# Patient Record
Sex: Female | Born: 1960 | Race: White | Hispanic: No | Marital: Married
Health system: Southern US, Community
[De-identification: ages and names within clinical notes are randomized; demographics above are authoritative.]

---

## 2015-12-12 ENCOUNTER — Other Ambulatory Visit: Payer: Self-pay | Admitting: Internal Medicine

## 2015-12-12 DIAGNOSIS — Z1231 Encounter for screening mammogram for malignant neoplasm of breast: Secondary | ICD-10-CM

## 2015-12-22 ENCOUNTER — Ambulatory Visit
Admission: RE | Admit: 2015-12-22 | Discharge: 2015-12-22 | Disposition: A | Discharge status: Home / Self Care | Payer: 59 | Source: Ambulatory Visit | Attending: Internal Medicine | Admitting: Internal Medicine

## 2015-12-22 DIAGNOSIS — Z1231 Encounter for screening mammogram for malignant neoplasm of breast: Secondary | ICD-10-CM

## 2016-10-04 DIAGNOSIS — L989 Disorder of the skin and subcutaneous tissue, unspecified: Secondary | ICD-10-CM | POA: Diagnosis not present

## 2016-10-04 DIAGNOSIS — M25562 Pain in left knee: Secondary | ICD-10-CM | POA: Diagnosis not present

## 2016-12-19 ENCOUNTER — Other Ambulatory Visit: Payer: Self-pay | Admitting: Internal Medicine

## 2016-12-19 DIAGNOSIS — Z1231 Encounter for screening mammogram for malignant neoplasm of breast: Secondary | ICD-10-CM

## 2017-01-09 ENCOUNTER — Ambulatory Visit
Admission: RE | Admit: 2017-01-09 | Discharge: 2017-01-09 | Disposition: A | Discharge status: Home / Self Care | Payer: 59 | Source: Ambulatory Visit | Attending: Internal Medicine | Admitting: Internal Medicine

## 2017-01-09 DIAGNOSIS — Z1231 Encounter for screening mammogram for malignant neoplasm of breast: Secondary | ICD-10-CM

## 2017-10-16 DIAGNOSIS — Z1211 Encounter for screening for malignant neoplasm of colon: Secondary | ICD-10-CM | POA: Diagnosis not present

## 2017-10-16 DIAGNOSIS — Z01419 Encounter for gynecological examination (general) (routine) without abnormal findings: Secondary | ICD-10-CM | POA: Diagnosis not present

## 2017-10-16 DIAGNOSIS — I10 Essential (primary) hypertension: Secondary | ICD-10-CM | POA: Diagnosis not present

## 2017-10-24 DIAGNOSIS — D485 Neoplasm of uncertain behavior of skin: Secondary | ICD-10-CM | POA: Diagnosis not present

## 2017-10-24 DIAGNOSIS — D239 Other benign neoplasm of skin, unspecified: Secondary | ICD-10-CM | POA: Diagnosis not present

## 2017-10-24 DIAGNOSIS — D2361 Other benign neoplasm of skin of right upper limb, including shoulder: Secondary | ICD-10-CM | POA: Diagnosis not present

## 2017-12-10 IMAGING — MG DIGITAL SCREENING BILATERAL MAMMOGRAM WITH CAD
4 series · 4 of 4 positions shown · non-contrast
Comparison: Previous exam(s).

CLINICAL DATA: Screening.

EXAM:
DIGITAL SCREENING BILATERAL MAMMOGRAM WITH CAD

[R CC]
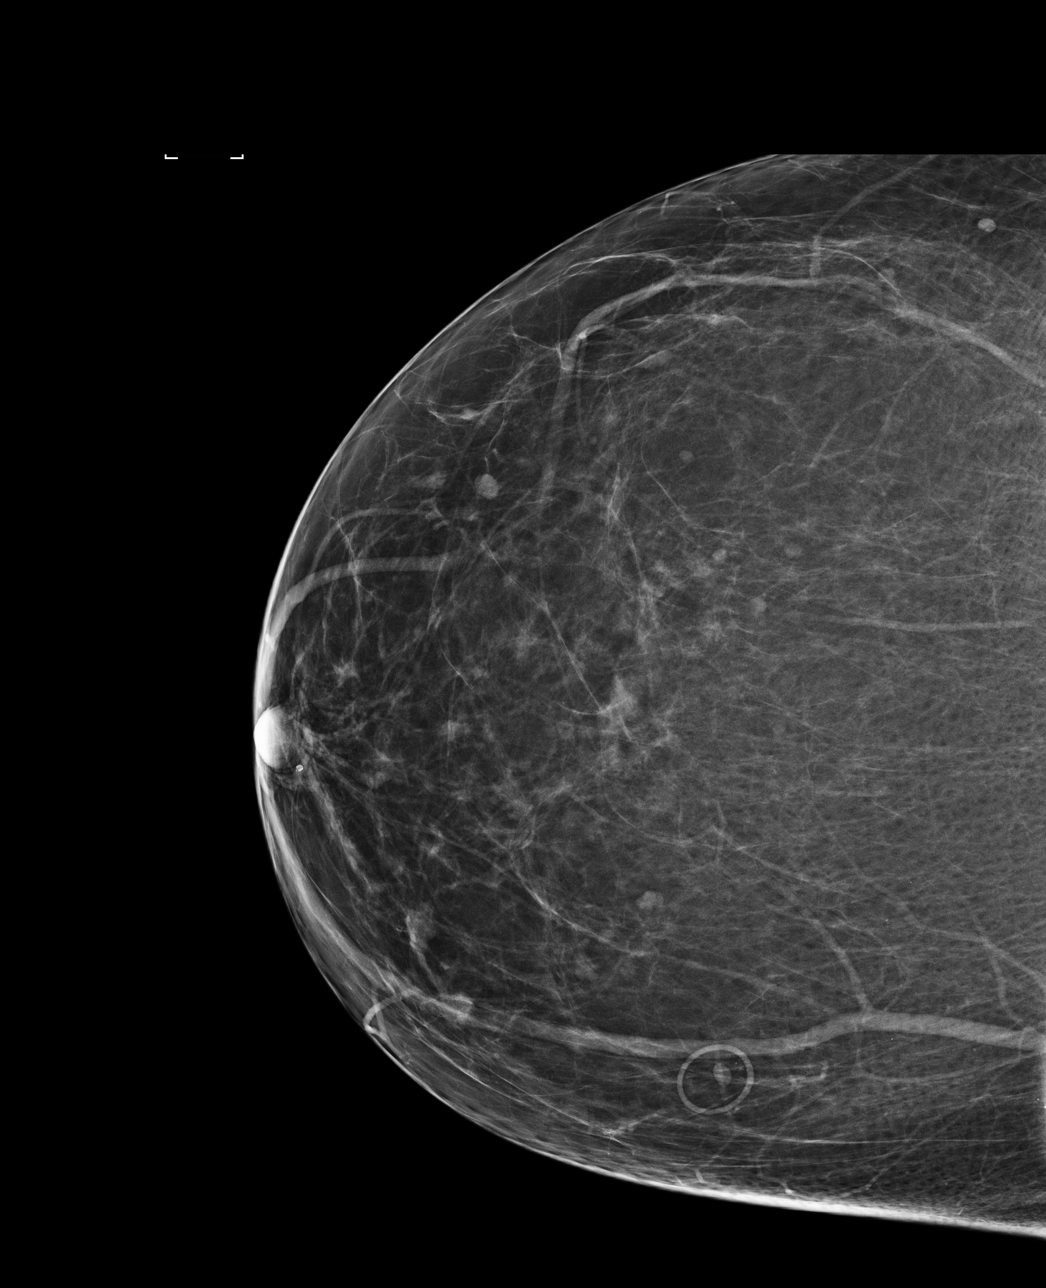

[L CC]
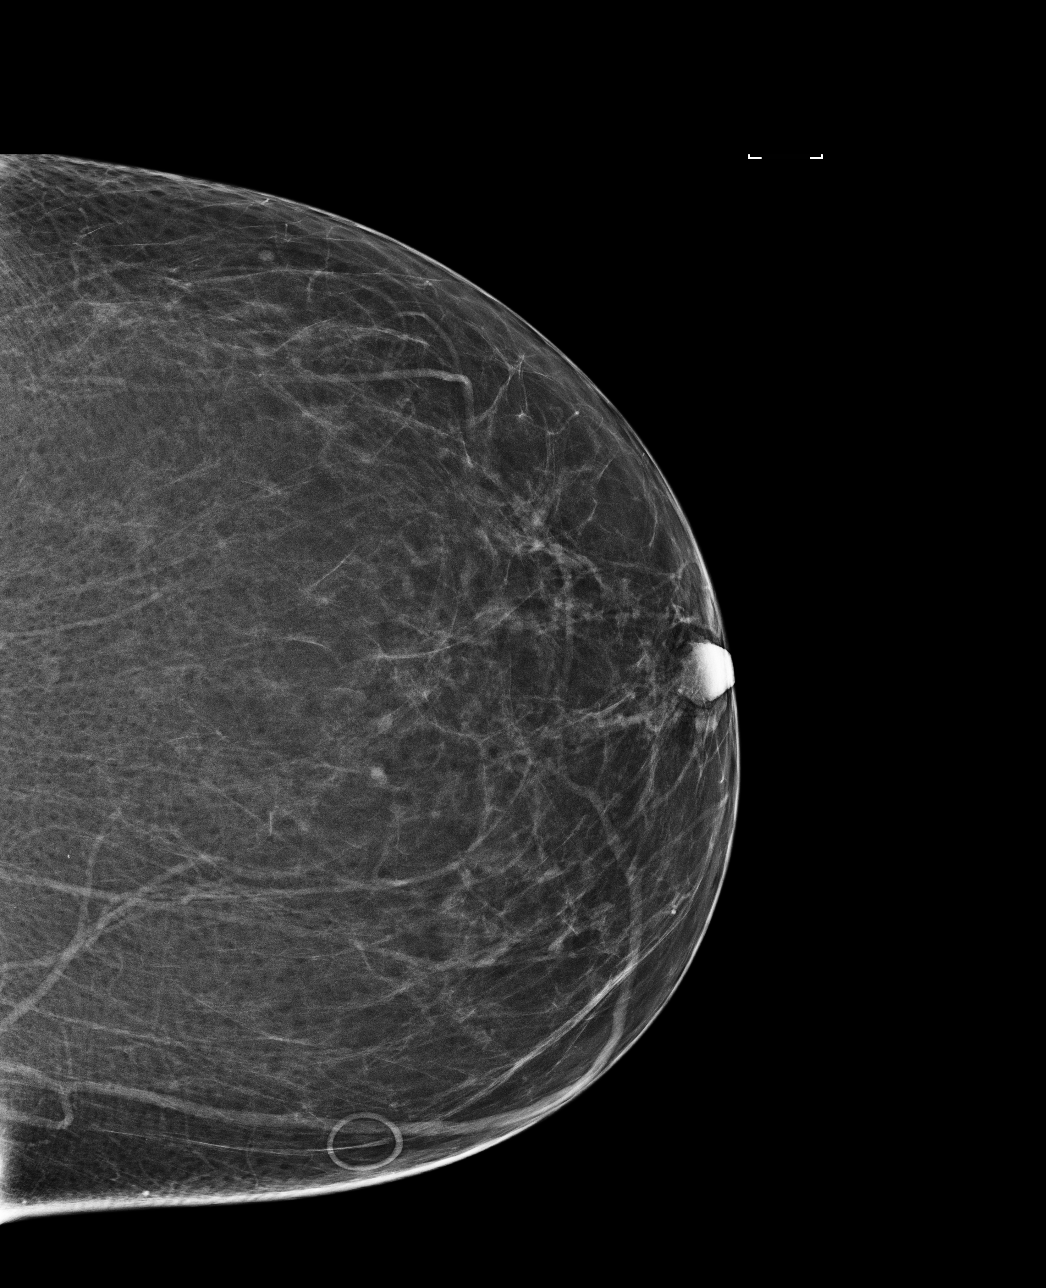

[L MLO]
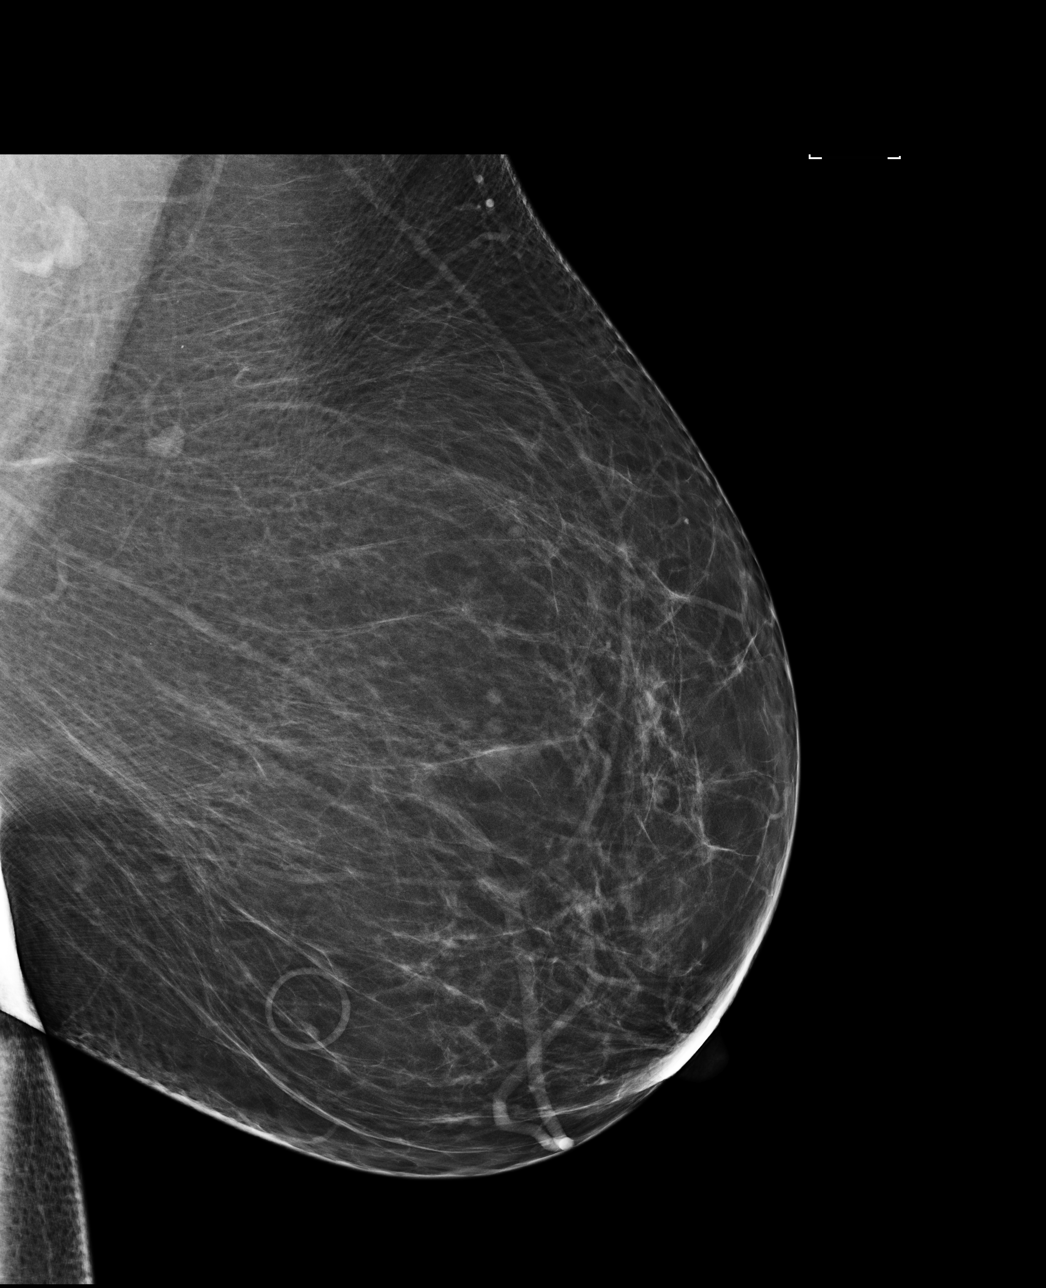

[R MLO]
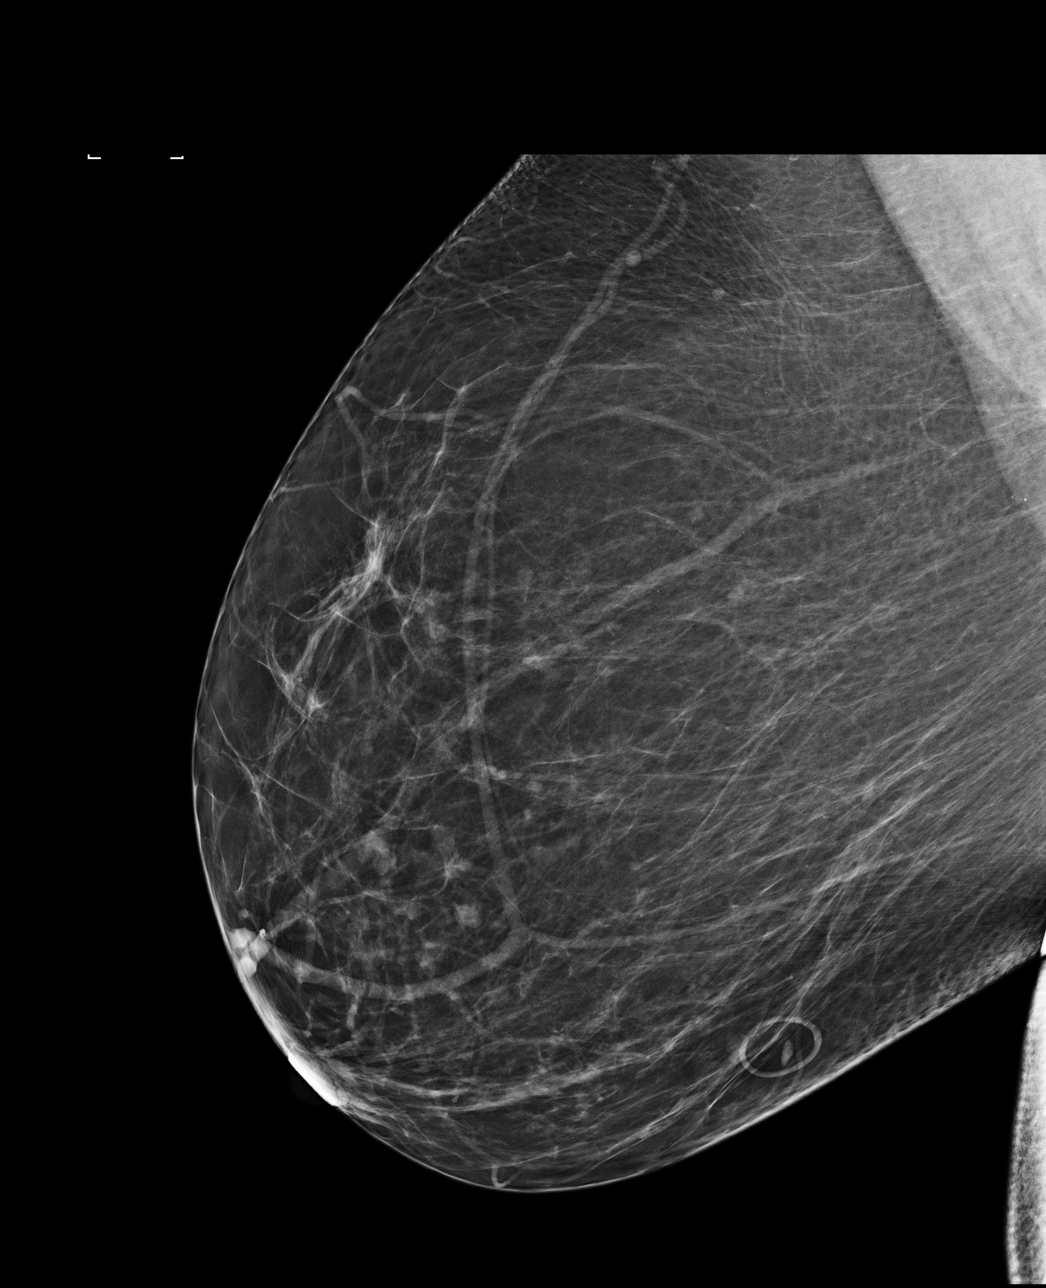

[4 of 4 positions shown; findings below may reference images not displayed]

ACR Breast Density Category b: There are scattered areas of
fibroglandular density.
FINDINGS: There are no findings suspicious for malignancy. Images were
processed with CAD.
IMPRESSION: No mammographic evidence of malignancy. A result letter of this
screening mammogram will be mailed directly to the patient.

RECOMMENDATION:
Screening mammogram in one year. (Code:AS-G-LCT)

BI-RADS CATEGORY  1: Negative.

## 2017-12-30 DIAGNOSIS — Z1211 Encounter for screening for malignant neoplasm of colon: Secondary | ICD-10-CM | POA: Diagnosis not present

## 2017-12-30 DIAGNOSIS — K5730 Diverticulosis of large intestine without perforation or abscess without bleeding: Secondary | ICD-10-CM | POA: Diagnosis not present

## 2018-08-24 DIAGNOSIS — J309 Allergic rhinitis, unspecified: Secondary | ICD-10-CM | POA: Diagnosis not present

## 2018-08-24 DIAGNOSIS — L0291 Cutaneous abscess, unspecified: Secondary | ICD-10-CM | POA: Diagnosis not present

## 2018-08-24 DIAGNOSIS — Z6833 Body mass index (BMI) 33.0-33.9, adult: Secondary | ICD-10-CM | POA: Diagnosis not present

## 2018-09-17 DIAGNOSIS — R300 Dysuria: Secondary | ICD-10-CM | POA: Diagnosis not present

## 2018-12-02 DIAGNOSIS — Z0000 Encounter for general adult medical examination without abnormal findings: Secondary | ICD-10-CM | POA: Diagnosis not present

## 2018-12-02 DIAGNOSIS — R82998 Other abnormal findings in urine: Secondary | ICD-10-CM | POA: Diagnosis not present

## 2018-12-09 DIAGNOSIS — Z0000 Encounter for general adult medical examination without abnormal findings: Secondary | ICD-10-CM | POA: Diagnosis not present

## 2018-12-09 DIAGNOSIS — G4709 Other insomnia: Secondary | ICD-10-CM | POA: Diagnosis not present

## 2018-12-09 DIAGNOSIS — E559 Vitamin D deficiency, unspecified: Secondary | ICD-10-CM | POA: Diagnosis not present

## 2018-12-09 DIAGNOSIS — Z23 Encounter for immunization: Secondary | ICD-10-CM | POA: Diagnosis not present

## 2018-12-09 DIAGNOSIS — I10 Essential (primary) hypertension: Secondary | ICD-10-CM | POA: Diagnosis not present

## 2018-12-15 DIAGNOSIS — Z1212 Encounter for screening for malignant neoplasm of rectum: Secondary | ICD-10-CM | POA: Diagnosis not present

## 2019-09-16 ENCOUNTER — Ambulatory Visit: Payer: 59 | Attending: Internal Medicine

## 2019-09-16 DIAGNOSIS — Z20822 Contact with and (suspected) exposure to covid-19: Secondary | ICD-10-CM

## 2019-09-18 LAB — NOVEL CORONAVIRUS, NAA: SARS-CoV-2, NAA: NOT DETECTED

## 2019-11-22 ENCOUNTER — Ambulatory Visit: Payer: 59 | Attending: Internal Medicine

## 2019-11-22 DIAGNOSIS — Z20822 Contact with and (suspected) exposure to covid-19: Secondary | ICD-10-CM

## 2019-11-23 ENCOUNTER — Telehealth: Payer: Self-pay | Admitting: *Deleted

## 2019-11-23 LAB — NOVEL CORONAVIRUS, NAA: SARS-CoV-2, NAA: DETECTED — AB

## 2019-11-23 NOTE — Telephone Encounter (Signed)
Calling for her Covid 19 results. Results pending at this time. Husband is positive for Covid 19 so she will remain in quarantine along with him.

## 2019-11-29 ENCOUNTER — Encounter: Payer: Self-pay | Admitting: Nurse Practitioner

## 2019-11-29 ENCOUNTER — Other Ambulatory Visit (HOSPITAL_COMMUNITY): Payer: Self-pay | Admitting: Nurse Practitioner

## 2019-11-29 ENCOUNTER — Telehealth (HOSPITAL_COMMUNITY): Payer: Self-pay | Admitting: Nurse Practitioner

## 2019-11-29 ENCOUNTER — Ambulatory Visit (HOSPITAL_COMMUNITY)
Admission: RE | Admit: 2019-11-29 | Discharge: 2019-11-29 | Disposition: A | Discharge status: Home / Self Care | Payer: 59 | Source: Ambulatory Visit | Attending: Pulmonary Disease | Admitting: Pulmonary Disease

## 2019-11-29 DIAGNOSIS — U071 COVID-19: Secondary | ICD-10-CM

## 2019-11-29 MED ORDER — SODIUM CHLORIDE 0.9 % IV SOLN
700.0000 mg | Freq: Once | INTRAVENOUS | Status: AC
  Administered 2019-11-29: 700 mg via INTRAVENOUS
  Filled 2019-11-29: qty 20

## 2019-11-29 MED ORDER — DIPHENHYDRAMINE HCL 50 MG/ML IJ SOLN: 50.0000 mg | Freq: Once | INTRAMUSCULAR | Status: DC | PRN

## 2019-11-29 MED ORDER — FAMOTIDINE IN NACL 20-0.9 MG/50ML-% IV SOLN: 20.0000 mg | Freq: Once | INTRAVENOUS | Status: DC | PRN

## 2019-11-29 MED ORDER — SODIUM CHLORIDE 0.9 % IV SOLN
INTRAVENOUS | Status: DC | PRN
  Administered 2019-11-29: 250 mL via INTRAVENOUS

## 2019-11-29 MED ORDER — EPINEPHRINE 0.3 MG/0.3ML IJ SOAJ: 0.3000 mg | Freq: Once | INTRAMUSCULAR | Status: DC | PRN

## 2019-11-29 MED ORDER — ALBUTEROL SULFATE HFA 108 (90 BASE) MCG/ACT IN AERS: 2.0000 | INHALATION_SPRAY | Freq: Once | RESPIRATORY_TRACT | Status: DC | PRN

## 2019-11-29 MED ORDER — METHYLPREDNISOLONE SODIUM SUCC 125 MG IJ SOLR: 125.0000 mg | Freq: Once | INTRAMUSCULAR | Status: DC | PRN

## 2019-11-29 NOTE — Progress Notes (Signed)
I connected by phone with Bonner Puna on 11/29/2019 at 12:21 PM to discuss the potential use of an new treatment for mild to moderate COVID-19 viral infection in non-hospitalized patients.  This patient is a 59 y.o. female that meets the FDA criteria for Emergency Use Authorization of bamlanivimab or casirivimab\imdevimab.  Has a (+) direct SARS-CoV-2 viral test result  Has mild or moderate COVID-19   Is ? 60 years of age and weighs ? 40 kg  Is NOT hospitalized due to COVID-19  Is NOT requiring oxygen therapy or requiring an increase in baseline oxygen flow rate due to COVID-19  Is within 10 days of symptom onset  Has at least one of the high risk factor(s) for progression to severe COVID-19 and/or hospitalization as defined in EUA.  Specific high risk criteria : Hypertension   I have spoken and communicated the following to the patient or parent/caregiver:  1. FDA has authorized the emergency use of bamlanivimab and casirivimab\imdevimab for the treatment of mild to moderate COVID-19 in adults and pediatric patients with positive results of direct SARS-CoV-2 viral testing who are 4 years of age and older weighing at least 40 kg, and who are at high risk for progressing to severe COVID-19 and/or hospitalization.  2. The significant known and potential risks and benefits of bamlanivimab and casirivimab\imdevimab, and the extent to which such potential risks and benefits are unknown.  3. Information on available alternative treatments and the risks and benefits of those alternatives, including clinical trials.  4. Patients treated with bamlanivimab and casirivimab\imdevimab should continue to self-isolate and use infection control measures (e.g., wear mask, isolate, social distance, avoid sharing personal items, clean and disinfect "high touch" surfaces, and frequent handwashing) according to CDC guidelines.   5. The patient or parent/caregiver has the option to accept or refuse  bamlanivimab or casirivimab\imdevimab .  After reviewing this information with the patient, The patient agreed to proceed with receiving the bamlanimivab infusion and will be provided a copy of the Fact sheet prior to receiving the infusion.Beckey Rutter, [place], AGNP-C [phone] ([place])

## 2019-11-29 NOTE — Telephone Encounter (Signed)
Called to Discuss with patient about Covid symptoms and the use of bamlanivimab, a monoclonal antibody infusion for those with mild to moderate Covid symptoms and at a high risk of hospitalization.     Pt is qualified for this infusion at the [facility] infusion center due to co-morbid conditions and/or a member of an at-risk group.     Symptoms started 2/21. Qualifies based on age > 71 and hx of hypertension, currently on medication. See orders encounter for further details.   Beckey Rutter, [place], AGNP-C [phone] ([place])

## 2019-11-29 NOTE — Progress Notes (Signed)
  Diagnosis: COVID-19  Physician: Dr. Patrick Wright  Procedure: [facility] Infusion [facility] Med: bamlanivimab infusion - Provided patient with bamlanimivab fact sheet for patients, parents and caregivers prior to infusion.  Complications: No immediate complications noted.  Discharge: Discharged home   Ally Yow 11/29/2019   

## 2019-11-29 NOTE — Discharge Instructions (Signed)

## 2020-09-28 DIAGNOSIS — Z23 Encounter for immunization: Secondary | ICD-10-CM | POA: Diagnosis not present

## 2020-10-19 DIAGNOSIS — Z23 Encounter for immunization: Secondary | ICD-10-CM | POA: Diagnosis not present

## 2020-12-25 DIAGNOSIS — Z0000 Encounter for general adult medical examination without abnormal findings: Secondary | ICD-10-CM | POA: Diagnosis not present

## 2020-12-25 DIAGNOSIS — E559 Vitamin D deficiency, unspecified: Secondary | ICD-10-CM | POA: Diagnosis not present

## 2020-12-25 DIAGNOSIS — R7989 Other specified abnormal findings of blood chemistry: Secondary | ICD-10-CM | POA: Diagnosis not present

## 2021-01-01 DIAGNOSIS — Z1331 Encounter for screening for depression: Secondary | ICD-10-CM | POA: Diagnosis not present

## 2021-01-01 DIAGNOSIS — Z1339 Encounter for screening examination for other mental health and behavioral disorders: Secondary | ICD-10-CM | POA: Diagnosis not present

## 2021-01-01 DIAGNOSIS — Z0000 Encounter for general adult medical examination without abnormal findings: Secondary | ICD-10-CM | POA: Diagnosis not present

## 2021-01-01 DIAGNOSIS — I10 Essential (primary) hypertension: Secondary | ICD-10-CM | POA: Diagnosis not present

## 2021-01-01 DIAGNOSIS — E559 Vitamin D deficiency, unspecified: Secondary | ICD-10-CM | POA: Diagnosis not present

## 2021-01-01 DIAGNOSIS — R82998 Other abnormal findings in urine: Secondary | ICD-10-CM | POA: Diagnosis not present

## 2021-01-01 DIAGNOSIS — Z1212 Encounter for screening for malignant neoplasm of rectum: Secondary | ICD-10-CM | POA: Diagnosis not present

## 2021-02-23 DIAGNOSIS — Z6831 Body mass index (BMI) 31.0-31.9, adult: Secondary | ICD-10-CM | POA: Diagnosis not present

## 2021-02-23 DIAGNOSIS — Z01419 Encounter for gynecological examination (general) (routine) without abnormal findings: Secondary | ICD-10-CM | POA: Diagnosis not present

## 2021-02-23 DIAGNOSIS — Z1231 Encounter for screening mammogram for malignant neoplasm of breast: Secondary | ICD-10-CM | POA: Diagnosis not present

## 2021-03-28 DIAGNOSIS — R059 Cough, unspecified: Secondary | ICD-10-CM | POA: Diagnosis not present

## 2021-03-28 DIAGNOSIS — U071 COVID-19: Secondary | ICD-10-CM | POA: Diagnosis not present

## 2021-05-03 DIAGNOSIS — M25551 Pain in right hip: Secondary | ICD-10-CM | POA: Diagnosis not present

## 2021-05-28 DIAGNOSIS — M25551 Pain in right hip: Secondary | ICD-10-CM | POA: Diagnosis not present

## 2021-06-05 DIAGNOSIS — M25551 Pain in right hip: Secondary | ICD-10-CM | POA: Diagnosis not present

## 2021-06-15 DIAGNOSIS — M25551 Pain in right hip: Secondary | ICD-10-CM | POA: Diagnosis not present

## 2021-06-20 DIAGNOSIS — M25551 Pain in right hip: Secondary | ICD-10-CM | POA: Diagnosis not present

## 2021-06-28 DIAGNOSIS — M25551 Pain in right hip: Secondary | ICD-10-CM | POA: Diagnosis not present

## 2021-07-19 DIAGNOSIS — M25551 Pain in right hip: Secondary | ICD-10-CM | POA: Diagnosis not present

## 2022-01-14 DIAGNOSIS — E559 Vitamin D deficiency, unspecified: Secondary | ICD-10-CM | POA: Diagnosis not present

## 2022-01-14 DIAGNOSIS — I10 Essential (primary) hypertension: Secondary | ICD-10-CM | POA: Diagnosis not present

## 2022-01-21 DIAGNOSIS — Z1331 Encounter for screening for depression: Secondary | ICD-10-CM | POA: Diagnosis not present

## 2022-01-21 DIAGNOSIS — E559 Vitamin D deficiency, unspecified: Secondary | ICD-10-CM | POA: Diagnosis not present

## 2022-01-21 DIAGNOSIS — Z0000 Encounter for general adult medical examination without abnormal findings: Secondary | ICD-10-CM | POA: Diagnosis not present

## 2022-01-21 DIAGNOSIS — R82998 Other abnormal findings in urine: Secondary | ICD-10-CM | POA: Diagnosis not present

## 2022-01-21 DIAGNOSIS — Z1339 Encounter for screening examination for other mental health and behavioral disorders: Secondary | ICD-10-CM | POA: Diagnosis not present

## 2022-02-28 DIAGNOSIS — Z6830 Body mass index (BMI) 30.0-30.9, adult: Secondary | ICD-10-CM | POA: Diagnosis not present

## 2022-02-28 DIAGNOSIS — Z1231 Encounter for screening mammogram for malignant neoplasm of breast: Secondary | ICD-10-CM | POA: Diagnosis not present

## 2022-02-28 DIAGNOSIS — Z01419 Encounter for gynecological examination (general) (routine) without abnormal findings: Secondary | ICD-10-CM | POA: Diagnosis not present

## 2022-07-03 DIAGNOSIS — L820 Inflamed seborrheic keratosis: Secondary | ICD-10-CM | POA: Diagnosis not present

## 2022-07-03 DIAGNOSIS — L814 Other melanin hyperpigmentation: Secondary | ICD-10-CM | POA: Diagnosis not present

## 2022-07-03 DIAGNOSIS — L821 Other seborrheic keratosis: Secondary | ICD-10-CM | POA: Diagnosis not present

## 2022-07-03 DIAGNOSIS — D225 Melanocytic nevi of trunk: Secondary | ICD-10-CM | POA: Diagnosis not present

## 2023-01-22 DIAGNOSIS — E785 Hyperlipidemia, unspecified: Secondary | ICD-10-CM | POA: Diagnosis not present

## 2023-01-22 DIAGNOSIS — I10 Essential (primary) hypertension: Secondary | ICD-10-CM | POA: Diagnosis not present

## 2023-01-29 DIAGNOSIS — E785 Hyperlipidemia, unspecified: Secondary | ICD-10-CM | POA: Diagnosis not present

## 2023-01-29 DIAGNOSIS — I10 Essential (primary) hypertension: Secondary | ICD-10-CM | POA: Diagnosis not present

## 2023-01-29 DIAGNOSIS — Z1331 Encounter for screening for depression: Secondary | ICD-10-CM | POA: Diagnosis not present

## 2023-01-29 DIAGNOSIS — Z0000 Encounter for general adult medical examination without abnormal findings: Secondary | ICD-10-CM | POA: Diagnosis not present

## 2023-01-29 DIAGNOSIS — G4700 Insomnia, unspecified: Secondary | ICD-10-CM | POA: Diagnosis not present

## 2023-01-29 DIAGNOSIS — Z1339 Encounter for screening examination for other mental health and behavioral disorders: Secondary | ICD-10-CM | POA: Diagnosis not present

## 2023-01-29 DIAGNOSIS — E559 Vitamin D deficiency, unspecified: Secondary | ICD-10-CM | POA: Diagnosis not present

## 2023-04-29 DIAGNOSIS — Z1231 Encounter for screening mammogram for malignant neoplasm of breast: Secondary | ICD-10-CM | POA: Diagnosis not present

## 2023-04-29 DIAGNOSIS — Z01419 Encounter for gynecological examination (general) (routine) without abnormal findings: Secondary | ICD-10-CM | POA: Diagnosis not present

## 2023-04-29 DIAGNOSIS — Z6832 Body mass index (BMI) 32.0-32.9, adult: Secondary | ICD-10-CM | POA: Diagnosis not present

## 2023-08-13 DIAGNOSIS — L814 Other melanin hyperpigmentation: Secondary | ICD-10-CM | POA: Diagnosis not present

## 2023-08-13 DIAGNOSIS — D2262 Melanocytic nevi of left upper limb, including shoulder: Secondary | ICD-10-CM | POA: Diagnosis not present

## 2023-08-13 DIAGNOSIS — D225 Melanocytic nevi of trunk: Secondary | ICD-10-CM | POA: Diagnosis not present

## 2023-08-13 DIAGNOSIS — L720 Epidermal cyst: Secondary | ICD-10-CM | POA: Diagnosis not present

## 2023-08-13 DIAGNOSIS — D485 Neoplasm of uncertain behavior of skin: Secondary | ICD-10-CM | POA: Diagnosis not present

## 2023-08-13 DIAGNOSIS — D2261 Melanocytic nevi of right upper limb, including shoulder: Secondary | ICD-10-CM | POA: Diagnosis not present

## 2023-12-29 DIAGNOSIS — L720 Epidermal cyst: Secondary | ICD-10-CM | POA: Diagnosis not present

## 2024-02-04 DIAGNOSIS — E559 Vitamin D deficiency, unspecified: Secondary | ICD-10-CM | POA: Diagnosis not present

## 2024-02-04 DIAGNOSIS — E785 Hyperlipidemia, unspecified: Secondary | ICD-10-CM | POA: Diagnosis not present

## 2024-02-16 DIAGNOSIS — E669 Obesity, unspecified: Secondary | ICD-10-CM | POA: Diagnosis not present

## 2024-02-16 DIAGNOSIS — Z0000 Encounter for general adult medical examination without abnormal findings: Secondary | ICD-10-CM | POA: Diagnosis not present

## 2024-02-16 DIAGNOSIS — Z1331 Encounter for screening for depression: Secondary | ICD-10-CM | POA: Diagnosis not present

## 2024-02-16 DIAGNOSIS — I10 Essential (primary) hypertension: Secondary | ICD-10-CM | POA: Diagnosis not present

## 2024-02-16 DIAGNOSIS — Z23 Encounter for immunization: Secondary | ICD-10-CM | POA: Diagnosis not present

## 2024-02-16 DIAGNOSIS — Z1339 Encounter for screening examination for other mental health and behavioral disorders: Secondary | ICD-10-CM | POA: Diagnosis not present

## 2024-02-16 DIAGNOSIS — R82998 Other abnormal findings in urine: Secondary | ICD-10-CM | POA: Diagnosis not present

## 2024-05-11 DIAGNOSIS — Z1231 Encounter for screening mammogram for malignant neoplasm of breast: Secondary | ICD-10-CM | POA: Diagnosis not present

## 2024-05-11 DIAGNOSIS — Z01419 Encounter for gynecological examination (general) (routine) without abnormal findings: Secondary | ICD-10-CM | POA: Diagnosis not present

## 2024-05-18 DIAGNOSIS — R058 Other specified cough: Secondary | ICD-10-CM | POA: Diagnosis not present

## 2024-05-18 DIAGNOSIS — J209 Acute bronchitis, unspecified: Secondary | ICD-10-CM | POA: Diagnosis not present

## 2024-08-13 DIAGNOSIS — D2262 Melanocytic nevi of left upper limb, including shoulder: Secondary | ICD-10-CM | POA: Diagnosis not present

## 2024-08-13 DIAGNOSIS — L821 Other seborrheic keratosis: Secondary | ICD-10-CM | POA: Diagnosis not present

## 2024-08-13 DIAGNOSIS — L814 Other melanin hyperpigmentation: Secondary | ICD-10-CM | POA: Diagnosis not present
